# Patient Record
Sex: Female | Born: 1955
Health system: Southern US, Community
[De-identification: ages and names within clinical notes are randomized; demographics above are authoritative.]

## PROBLEM LIST (undated history)

## (undated) DIAGNOSIS — G8929 Other chronic pain: Secondary | ICD-10-CM

## (undated) DIAGNOSIS — J189 Pneumonia, unspecified organism: Secondary | ICD-10-CM

## (undated) DIAGNOSIS — I10 Essential (primary) hypertension: Secondary | ICD-10-CM

## (undated) DIAGNOSIS — F1020 Alcohol dependence, uncomplicated: Secondary | ICD-10-CM

## (undated) DIAGNOSIS — B1920 Unspecified viral hepatitis C without hepatic coma: Secondary | ICD-10-CM

## (undated) DIAGNOSIS — F319 Bipolar disorder, unspecified: Secondary | ICD-10-CM

## (undated) DIAGNOSIS — K219 Gastro-esophageal reflux disease without esophagitis: Secondary | ICD-10-CM

## (undated) DIAGNOSIS — J45909 Unspecified asthma, uncomplicated: Secondary | ICD-10-CM

## (undated) DIAGNOSIS — K769 Liver disease, unspecified: Secondary | ICD-10-CM

## (undated) DIAGNOSIS — Z8719 Personal history of other diseases of the digestive system: Secondary | ICD-10-CM

## (undated) DIAGNOSIS — F32A Depression, unspecified: Secondary | ICD-10-CM

## (undated) DIAGNOSIS — M1990 Unspecified osteoarthritis, unspecified site: Secondary | ICD-10-CM

## (undated) DIAGNOSIS — F419 Anxiety disorder, unspecified: Secondary | ICD-10-CM

## (undated) DIAGNOSIS — F329 Major depressive disorder, single episode, unspecified: Secondary | ICD-10-CM

## (undated) DIAGNOSIS — F1910 Other psychoactive substance abuse, uncomplicated: Secondary | ICD-10-CM

## (undated) DIAGNOSIS — J449 Chronic obstructive pulmonary disease, unspecified: Secondary | ICD-10-CM

## (undated) DIAGNOSIS — R569 Unspecified convulsions: Secondary | ICD-10-CM

## (undated) DIAGNOSIS — N390 Urinary tract infection, site not specified: Secondary | ICD-10-CM

## (undated) HISTORY — DX: Other chronic pain: G89.29

## (undated) HISTORY — DX: Liver disease, unspecified: K76.9

## (undated) HISTORY — PX: TONSILLECTOMY: SUR1361

## (undated) HISTORY — DX: Depression, unspecified: F32.A

## (undated) HISTORY — DX: Unspecified asthma, uncomplicated: J45.909

## (undated) HISTORY — DX: Urinary tract infection, site not specified: N39.0

## (undated) HISTORY — DX: Unspecified viral hepatitis C without hepatic coma: B19.20

## (undated) HISTORY — DX: Personal history of other diseases of the digestive system: Z87.19

## (undated) HISTORY — PX: TUBAL LIGATION: SHX77

## (undated) HISTORY — DX: Essential (primary) hypertension: I10

## (undated) HISTORY — DX: Alcohol dependence, uncomplicated: F10.20

## (undated) HISTORY — DX: Unspecified convulsions: R56.9

## (undated) HISTORY — DX: Bipolar disorder, unspecified: F31.9

## (undated) HISTORY — DX: Gastro-esophageal reflux disease without esophagitis: K21.9

## (undated) HISTORY — DX: Pneumonia, unspecified organism: J18.9

## (undated) HISTORY — PX: OTHER SURGICAL HISTORY: SHX169

## (undated) HISTORY — DX: Other psychoactive substance abuse, uncomplicated: F19.10

## (undated) HISTORY — DX: Anxiety disorder, unspecified: F41.9

## (undated) HISTORY — DX: Unspecified osteoarthritis, unspecified site: M19.90

## (undated) HISTORY — DX: Chronic obstructive pulmonary disease, unspecified: J44.9

---
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
## 2014-02-19 HISTORY — PX: ESOPHAGOGASTRODUODENOSCOPY: SHX1529

## 2014-02-19 HISTORY — PX: COLONOSCOPY: SHX174

## 2018-12-27 ENCOUNTER — Encounter: Payer: Self-pay | Admitting: Gastroenterology

## 2019-01-14 ENCOUNTER — Encounter: Payer: Self-pay | Admitting: Gastroenterology

## 2019-01-22 ENCOUNTER — Encounter: Payer: Self-pay | Admitting: Gastroenterology

## 2019-01-22 ENCOUNTER — Ambulatory Visit (INDEPENDENT_AMBULATORY_CARE_PROVIDER_SITE_OTHER): Payer: Medicare Other | Admitting: Gastroenterology

## 2019-01-22 ENCOUNTER — Other Ambulatory Visit: Payer: Self-pay

## 2019-01-22 VITALS — BP 136/82 | HR 70 | Temp 98.2°F | Ht 72.0 in | Wt 205.1 lb

## 2019-01-22 DIAGNOSIS — Z1159 Encounter for screening for other viral diseases: Secondary | ICD-10-CM

## 2019-01-22 DIAGNOSIS — Z800 Family history of malignant neoplasm of digestive organs: Secondary | ICD-10-CM

## 2019-01-22 DIAGNOSIS — R7989 Other specified abnormal findings of blood chemistry: Secondary | ICD-10-CM

## 2019-01-22 DIAGNOSIS — Z1211 Encounter for screening for malignant neoplasm of colon: Secondary | ICD-10-CM | POA: Diagnosis not present

## 2019-01-22 DIAGNOSIS — R1013 Epigastric pain: Secondary | ICD-10-CM | POA: Diagnosis not present

## 2019-01-22 DIAGNOSIS — K219 Gastro-esophageal reflux disease without esophagitis: Secondary | ICD-10-CM | POA: Diagnosis not present

## 2019-01-22 DIAGNOSIS — R945 Abnormal results of liver function studies: Secondary | ICD-10-CM

## 2019-01-22 MED ORDER — OMEPRAZOLE 20 MG PO CPDR: 20.0000 mg | DELAYED_RELEASE_CAPSULE | Freq: Every day | ORAL | 11 refills | Status: AC

## 2019-01-22 MED ORDER — SUPREP BOWEL PREP KIT 17.5-3.13-1.6 GM/177ML PO SOLN: 1.0000 | ORAL | 0 refills | Status: AC

## 2019-01-22 MED ORDER — ONDANSETRON 4 MG PO TBDP: ORAL_TABLET | ORAL | 0 refills | Status: DC
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
## 2019-01-22 NOTE — Patient Instructions (Addendum)
If you are age 64 or older, your body mass index should be between 23-30. Your Body mass index is 27.82 kg/m. If this is out of the aforementioned range listed, please consider follow up with your Primary Care Provider.  If you are age 38 or younger, your body mass index should be between 19-25. Your Body mass index is 27.82 kg/m. If this is out of the aformentioned range listed, please consider follow up with your Primary Care Provider.   You have been scheduled for an abdominal ultrasound with elastography at [facility] (1st floor). Your appointment is scheduled for 01/29/19 at 10am. Please arrive 15 minutes prior to your scheduled appointment for registration purposes. Make certain not to have anything to eat or drink 6 hours prior to your procedure. Should you need to reschedule your appointment, you may contact radiology at [phone].  Liver Elastography Various chronic liver diseases such as hepatitis B, C, and fatty liver disease can lead to tissue damage and subsequent scar tissue formation. As the scar tissue accumulates, the liver loses some of its elasticity and becomes stiffer. Liver elastography involves the use of a surface ultrasound probe that delivers a low frequency pulse or shear wave to a small volume of liver tissue under the rib cage. The transmission of the sound wave is completely painless. How Is a Liver Elastography Performed? The liver is located in the right upper abdomen under the rib cage. Patients are asked to lie flat on an examination table. A technician places the FibroScan probe between the ribs on the right side of the lower chest wall. A series of 10 painless pulses are then applied to the liver. The results are recorded on the equipment and an overall liver stiffness score is generated. This score is then interpreted by a qualified physician to predict the likelihood of advanced fibrosis or cirrhosis.  Patients are asked to wear loose clothing and  should not consume any liquids or solids for a minimum of 4 hours before the test to increase the likelihood of obtaining reliable test results. The scan will take 10 to 15 minutes to complete, but patients should plan on being available for 30 minutes to allow time for preparation   Please go to the lab at [facility] ([place] You will need to go to level "B", you do not need an appointment for this. Hours available are 7:30 am - 4:30 pm.    You have been scheduled for an endoscopy and colonoscopy. Please follow the written instructions given to you at your visit today. Please pick up your prep supplies at the pharmacy within the next 1-3 days. If you use inhalers (even only as needed), please bring them with you on the day of your procedure. Your physician has requested that you go to www.startemmi.com and enter the access code given to you at your visit today. This web site gives a general overview about your procedure. However, you should still follow specific instructions given to you by our office regarding your preparation for the procedure.  Due to recent COVID-19 restrictions implemented by [employer] and state authorities and in an effort to keep both patients and staff as safe as possible, [place] requires COVID-19 testing prior to any scheduled endoscopic procedure. The testing center is located at [address] [place], [place] [address] in the [facility] [employer]  suite.  Your appointment has been scheduled for 12:20pm  on 02/10/19.  Please bring your insurance cards to this appointment. You will require your COVID screen 2 business days prior to your endoscopic procedure.  You are not required to quarantine after your screening.  You will only receive a phone call with the results if it is POSITIVE.  If you do not receive a call the day before your procedure you should begin your prep, if ordered,  and you should report to the endo center for your procedure at your designated appointment arrival time ( one hour prior to the procedure time). There is no cost to you for the screening on the day of the swab.  [facility] will file with your insurance company for the testing.    You may receive an automated phone call prior to your procedure or have a message in your MyChart that you have an appointment for a BP/15 at the [facility], please disregard this message.  Your testing will be at the [place] [place] location.   If you are leaving [facility] travel [address] on [address], turn left onto [address], turn night onto [address] at the 1st stop light turn right, pass the [employer] on your right and proceed to [place] (white building).   We have sent the following medications to your pharmacy for you to pick up at your convenience: Omeprazole Zofran Suprep  Two days before your procedure: Mix 3 packs (or capfuls) of Miralax in 48 ounces of clear liquid and drink at 6pm.    Thank you,  Dr. Jackquline Denmark

## 2019-01-28 ENCOUNTER — Telehealth: Payer: Self-pay | Admitting: Gastroenterology

## 2019-01-28 MED ORDER — ONDANSETRON 4 MG PO TBDP: ORAL_TABLET | ORAL | 0 refills | Status: DC

## 2019-01-28 NOTE — Telephone Encounter (Signed)
Pt stated that [facility] told her that quantity was not specified on ondansetron prescription.

## 2019-01-28 NOTE — Telephone Encounter (Signed)
Resent prescription to patients pharmacy.  

## 2019-01-29 ENCOUNTER — Other Ambulatory Visit: Payer: Self-pay

## 2019-01-29 ENCOUNTER — Ambulatory Visit (HOSPITAL_COMMUNITY)
Admission: RE | Admit: 2019-01-29 | Discharge: 2019-01-29 | Disposition: A | Discharge status: Home / Self Care | Payer: Medicare Other | Source: Ambulatory Visit | Attending: Gastroenterology | Admitting: Gastroenterology

## 2019-01-29 DIAGNOSIS — Z1211 Encounter for screening for malignant neoplasm of colon: Secondary | ICD-10-CM | POA: Diagnosis present

## 2019-01-29 DIAGNOSIS — R1013 Epigastric pain: Secondary | ICD-10-CM | POA: Insufficient documentation

## 2019-01-29 DIAGNOSIS — Z800 Family history of malignant neoplasm of digestive organs: Secondary | ICD-10-CM | POA: Diagnosis present

## 2019-01-29 DIAGNOSIS — R7989 Other specified abnormal findings of blood chemistry: Secondary | ICD-10-CM

## 2019-01-29 DIAGNOSIS — R945 Abnormal results of liver function studies: Secondary | ICD-10-CM

## 2019-01-29 DIAGNOSIS — K219 Gastro-esophageal reflux disease without esophagitis: Secondary | ICD-10-CM | POA: Insufficient documentation

## 2019-01-29 LAB — HEPATITIS A ANTIBODY, TOTAL: hep A Total Ab: REACTIVE — AB

## 2019-01-29 LAB — HEPATITIS B SURFACE ANTIGEN: Hepatitis B Surface Ag: NONREACTIVE

## 2019-01-30 ENCOUNTER — Other Ambulatory Visit: Payer: Self-pay

## 2019-01-30 LAB — HCV RNA QUANT
HCV Quantitative Log: 6.336 log10 IU/mL (ref >1.70)
HCV Quantitative: 2170000 IU/mL (ref >50)

## 2019-01-30 MED ORDER — ONDANSETRON 4 MG PO TBDP: ORAL_TABLET | ORAL | 0 refills | Status: DC

## 2019-01-31 ENCOUNTER — Other Ambulatory Visit: Payer: Self-pay

## 2019-01-31 DIAGNOSIS — D509 Iron deficiency anemia, unspecified: Secondary | ICD-10-CM

## 2019-01-31 DIAGNOSIS — K50919 Crohn's disease, unspecified, with unspecified complications: Secondary | ICD-10-CM

## 2019-01-31 DIAGNOSIS — R197 Diarrhea, unspecified: Secondary | ICD-10-CM

## 2019-02-03 ENCOUNTER — Telehealth: Payer: Self-pay | Admitting: Gastroenterology

## 2019-02-03 DIAGNOSIS — D509 Iron deficiency anemia, unspecified: Secondary | ICD-10-CM

## 2019-02-03 DIAGNOSIS — K50919 Crohn's disease, unspecified, with unspecified complications: Secondary | ICD-10-CM

## 2019-02-03 DIAGNOSIS — R197 Diarrhea, unspecified: Secondary | ICD-10-CM

## 2019-02-03 NOTE — Telephone Encounter (Signed)
Called and spoke with Margaret-Margaret informed of information in chart and that the patient is aware of need for additional lab work; Joycelyn Schmid reports she will notify the patient of appt date/time for [facility]; amb ref sent for pre cert request for patient to be seen by [facility];

## 2019-02-03 NOTE — Telephone Encounter (Signed)
Margaret at [employer]. For Infectious disease need clarification on order sent. She states that order indicates Hep C but dx crohns. Pls call her.

## 2019-02-06 ENCOUNTER — Ambulatory Visit (INDEPENDENT_AMBULATORY_CARE_PROVIDER_SITE_OTHER): Payer: Medicare Other | Admitting: Family

## 2019-02-06 ENCOUNTER — Other Ambulatory Visit: Payer: Self-pay

## 2019-02-06 ENCOUNTER — Encounter: Payer: Self-pay | Admitting: Family

## 2019-02-06 ENCOUNTER — Telehealth: Payer: Self-pay | Admitting: Pharmacy Technician

## 2019-02-06 DIAGNOSIS — B182 Chronic viral hepatitis C: Secondary | ICD-10-CM | POA: Diagnosis present

## 2019-02-06 NOTE — Telephone Encounter (Signed)
[facility] Patient Advocate Encounter    Findings of the benefits investigation for 11/04:   Insurance: [employer] -active Estimated copay amount: $2941.25 (her deductible has been met)  Prior Authorization: indicated that one is not needed at this time.  Will reach out to the patient via phone to get her household size and income and we can file to get a $30,000 grant to cover the duration of her treatment. We will have no problem getting the med to the patient at $0. Filling at [facility].

## 2019-02-06 NOTE — Assessment & Plan Note (Signed)
Kayla Pierce is a 63 year old female with chronic hepatitis C who is treatment experienced previously with Ribavarin and interferon x6 months.  It is unclear if she achieved a sustained viremic response and successfully cleared hepatitis C versus treatment failure of her previous regimen.  She currently has a viral load of 2.17 million and recent ultrasound with no masses or lesions.  Reviewed available lab work and discussed plan of care including treatment options for hepatitis C.  We will check genotype, platelets, and hepatic panel today.  She has no infection with hepatitis B.  Treatment will likely be either Epclusa or Mavyret pending blood work results.
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
## 2019-02-06 NOTE — Patient Instructions (Signed)
Nice to see you.  We will check your blood work today and call you with the results.  Limit acetaminophen (Tylenol) usage to no more than 2 grams (2,000 mg) per day.  Avoid alcohol.  Do not share toothbrushes or razors.  Practice safe sex to protect against transmission as well as sexually transmitted disease.    Hepatitis C Hepatitis C is a viral infection of the liver. It can lead to scarring of the liver (cirrhosis), liver failure, or liver cancer. Hepatitis C may go undetected for months or years because people with the infection may not have symptoms, or they may have only mild symptoms. What are the causes? This condition is caused by the hepatitis C virus (HCV). The virus can spread from person to person (is contagious) through:  Blood.  Childbirth. A woman who has hepatitis C can pass it to her baby during birth.  Bodily fluids, such as breast milk, tears, semen, vaginal fluids, and saliva.  Blood transfusions or organ transplants done in the [place] before 1992.  What increases the risk? The following factors may make you more likely to develop this condition:  Having contact with unclean (contaminated) needles or syringes. This may result from: ? Acupuncture. ? Tattoing. ? Body piercing. ? Injecting drugs.  Having unprotected sex with someone who is infected.  Needing treatment to filter your blood (kidney dialysis).  Having HIV (human immunodeficiency virus) or AIDS (acquired immunodeficiency syndrome).  Working in a job that involves contact with blood or bodily fluids, such as health care.  What are the signs or symptoms? Symptoms of this condition include:  Fatigue.  Loss of appetite.  Nausea.  Vomiting.  Abdominal pain.  Dark yellow urine.  Yellowish skin and eyes (jaundice).  Itchy skin.  Clay-colored bowel movements.  Joint pain.  Bleeding and bruising easily.  Fluid building up in your stomach (ascites).  In some cases, you  may not have any symptoms. How is this diagnosed? This condition is diagnosed with:  Blood tests.  Other tests to check how well your liver is functioning. They may include: ? Magnetic resonance elastography (MRE). This imaging test uses MRIs and sound waves to measure liver stiffness. ? Transient elastography. This imaging test uses ultrasounds to measure liver stiffness. ? Liver biopsy. This test requires taking a small tissue sample from your liver to examine it under a microscope.  How is this treated? Your health care provider may perform noninvasive tests or a liver biopsy to help decide the best course of treatment. Treatment may include:  Antiviral medicines and other medicines.  Follow-up treatments every 6-12 months for infections or other liver conditions.  Receiving a donated liver (liver transplant).  Follow these instructions at home: Medicines  Take over-the-counter and prescription medicines only as told by your health care provider.  Take your antiviral medicine as told by your health care provider. Do not stop taking the antiviral even if you start to feel better.  Do not take any medicines unless approved by your health care provider, including over-the-counter medicines and birth control pills. Activity  Rest as needed.  Do not have sex unless approved by your health care provider.  Ask your health care provider when you may return to school or work. Eating and drinking  Eat a balanced diet with plenty of fruits and vegetables, whole grains, and lowfat (lean) meats or non-meat proteins (such as beans or tofu).  Drink enough fluids to keep your urine clear or pale yellow.  Do not drink alcohol. General instructions  Do not share toothbrushes, nail clippers, or razors.  Wash your hands frequently with soap and water. If soap and water are not available, use hand sanitizer.  Cover any cuts or open sores on your skin to prevent spreading the virus.   Keep all follow-up visits as told by your health care provider. This is important. You may need follow-up visits every 6-12 months. How is this prevented? There is no vaccine for hepatitis C. The only way to prevent the disease is to reduce the risk of exposure to the virus. Make sure you:  Wash your hands frequently with soap and water. If soap and water are not available, use hand sanitizer.  Do not share needles or syringes.  Practice safe sex and use condoms.  Avoid handling blood or bodily fluids without gloves or other protection.  Avoid getting tattoos or piercings in shops or other locations that are not clean.  Contact a health care provider if:  You have a fever.  You develop abdominal pain.  You pass dark urine.  You pass clay-colored stools.  You develop joint pain. Get help right away if:  You have increasing fatigue or weakness.  You lose your appetite.  You cannot eat or drink without vomiting.  You develop jaundice or your jaundice gets worse.  You bruise or bleed easily. Summary  Hepatitis C is a viral infection of the liver. It can lead to scarring of the liver (cirrhosis), liver failure, or liver cancer.  The hepatitis C virus (HCV) causes this condition. The virus can pass from person to person (is contagious).  You should not take any medicines unless approved by your health care provider. This includes over-the-counter medicines and birth control pills. This information is not intended to replace advice given to you by your health care provider. Make sure you discuss any questions you have with your health care provider. Document Released: 03/17/2000 Document Revised: 04/25/2016 Document Reviewed: 04/25/2016 Elsevier Interactive Patient Education  [name].

## 2019-02-10 ENCOUNTER — Other Ambulatory Visit: Payer: Self-pay | Admitting: Gastroenterology

## 2019-02-10 ENCOUNTER — Encounter

## 2019-02-11 ENCOUNTER — Other Ambulatory Visit: Payer: Self-pay | Admitting: Family

## 2019-02-11 LAB — HEPATIC FUNCTION PANEL
AG Ratio: 1.1 (calc) (ref 1.0–2.5)
ALT: 93 U/L — ABNORMAL HIGH (ref 6–29)
AST: 92 U/L — ABNORMAL HIGH (ref 10–35)
Albumin: 3.7 g/dL (ref 3.6–5.1)
Alkaline phosphatase (APISO): 76 U/L (ref 37–153)
Bilirubin, Direct: 0.2 mg/dL (ref 0.0–0.2)
Globulin: 3.3 g/dL (calc) (ref 1.9–3.7)
Indirect Bilirubin: 0.4 mg/dL (calc) (ref 0.2–1.2)
Total Bilirubin: 0.6 mg/dL (ref 0.2–1.2)
Total Protein: 7 g/dL (ref 6.1–8.1)

## 2019-02-11 LAB — CBC
HCT: 44.2 % (ref 35.0–45.0)
Hemoglobin: 15.3 g/dL (ref 11.7–15.5)
MCH: 31.5 pg (ref 27.0–33.0)
MCHC: 34.6 g/dL (ref 32.0–36.0)
MCV: 90.9 fL (ref 80.0–100.0)
MPV: 12.5 fL (ref 7.5–12.5)
Platelets: 96 10*3/uL — ABNORMAL LOW (ref 140–400)
RBC: 4.86 10*6/uL (ref 3.80–5.10)
RDW: 12.2 % (ref 11.0–15.0)
WBC: 5.8 10*3/uL (ref 3.8–10.8)

## 2019-02-11 LAB — HEPATITIS B SURFACE ANTIBODY,QUALITATIVE: Hep B S Ab: REACTIVE — AB

## 2019-02-11 LAB — HEPATITIS C GENOTYPE

## 2019-02-11 LAB — SARS CORONAVIRUS 2 (TAT 6-24 HRS): SARS Coronavirus 2: NEGATIVE

## 2019-02-11 MED ORDER — MAVYRET 100-40 MG PO TABS: 3.0000 | ORAL_TABLET | Freq: Every day | ORAL | 2 refills | Status: DC

## 2019-02-11 MED ORDER — MAVYRET 100-40 MG PO TABS: 3.0000 | ORAL_TABLET | Freq: Every day | ORAL | 2 refills | Status: AC

## 2019-02-11 NOTE — Addendum Note (Signed)
Addended by: Darletta Moll on: 02/11/2019 04:59 PM   Modules accepted: Orders

## 2019-02-12 ENCOUNTER — Other Ambulatory Visit: Payer: Self-pay

## 2019-02-12 ENCOUNTER — Ambulatory Visit (AMBULATORY_SURGERY_CENTER): Payer: Medicare Other | Admitting: Gastroenterology

## 2019-02-12 ENCOUNTER — Encounter: Payer: Self-pay | Admitting: Gastroenterology

## 2019-02-12 VITALS — BP 138/80 | HR 59 | Temp 98.8°F | Resp 12 | Ht 72.0 in | Wt 199.0 lb

## 2019-02-12 DIAGNOSIS — K2100 Gastro-esophageal reflux disease with esophagitis, without bleeding: Secondary | ICD-10-CM

## 2019-02-12 DIAGNOSIS — Z800 Family history of malignant neoplasm of digestive organs: Secondary | ICD-10-CM | POA: Diagnosis not present

## 2019-02-12 DIAGNOSIS — D124 Benign neoplasm of descending colon: Secondary | ICD-10-CM | POA: Diagnosis not present

## 2019-02-12 DIAGNOSIS — D122 Benign neoplasm of ascending colon: Secondary | ICD-10-CM

## 2019-02-12 DIAGNOSIS — K2950 Unspecified chronic gastritis without bleeding: Secondary | ICD-10-CM | POA: Diagnosis not present

## 2019-02-12 DIAGNOSIS — Z1211 Encounter for screening for malignant neoplasm of colon: Secondary | ICD-10-CM | POA: Diagnosis present

## 2019-02-12 MED ORDER — SODIUM CHLORIDE 0.9 % IV SOLN: 500.0000 mL | Freq: Once | INTRAVENOUS | Status: DC

## 2019-02-12 NOTE — Progress Notes (Signed)
Called to room to assist during endoscopic procedure.  Patient ID and intended procedure confirmed with present staff. Received instructions for my participation in the procedure from the performing physician.  

## 2019-02-12 NOTE — Patient Instructions (Signed)
HANDOUTS PROVIDED ON: GASTRITIS, POLYPS, DIVERTICULOSIS, & HEMORRHOIDS  THE BIOPSIES & POLYPS TAKEN TODAY HAVE BEEN SENT FOR PATHOLOGY.  THE RESULTS CAN TAKE 2-3 WEEKS TO RECEIVE.  BASED ON THE RESULTS IS WHEN YOUR NEXT COLONOSCOPY WILL BE RECOMMENDED.  YOU MAY RESUME YOUR PREVIOUS DIET AND MEDICATION SCHEDULE.  [place] YOU FOR ALLOWING [place] TO CARE FOR YOU TODAY!!!  YOU HAD AN ENDOSCOPIC PROCEDURE TODAY AT [facility]:   Refer to the procedure report that was given to you for any specific questions about what was found during the examination.  If the procedure report does not answer your questions, please call your gastroenterologist to clarify.  If you requested that your care partner not be given the details of your procedure findings, then the procedure report has been included in a sealed envelope for you to review at your convenience later.  YOU SHOULD EXPECT: Some feelings of bloating in the abdomen. Passage of more gas than usual.  Walking can help get rid of the air that was put into your GI tract during the procedure and reduce the bloating. If you had a lower endoscopy (such as a colonoscopy or flexible sigmoidoscopy) you may notice spotting of blood in your stool or on the toilet paper. If you underwent a bowel prep for your procedure, you may not have a normal bowel movement for a few days.  Please Note:  You might notice some irritation and congestion in your nose or some drainage.  This is from the oxygen used during your procedure.  There is no need for concern and it should clear up in a day or so.  SYMPTOMS TO REPORT IMMEDIATELY:   Following lower endoscopy (colonoscopy or flexible sigmoidoscopy):  Excessive amounts of blood in the stool  Significant tenderness or worsening of abdominal pains  Swelling of the abdomen that is new, acute  Fever of 100F or higher   Following upper endoscopy (EGD)  Vomiting of blood or coffee ground material  New chest pain or  pain under the shoulder blades  Painful or persistently difficult swallowing  New shortness of breath  Fever of 100F or higher  Black, tarry-looking stools  For urgent or emergent issues, a gastroenterologist can be reached at any hour by calling [phone].   DIET:  We do recommend a small meal at first, but then you may proceed to your regular diet.  Drink plenty of fluids but you should avoid alcoholic beverages for 24 hours.  ACTIVITY:  You should plan to take it easy for the rest of today and you should NOT DRIVE or use heavy machinery until tomorrow (because of the sedation medicines used during the test).    FOLLOW UP: Our staff will call the number listed on your records 48-72 hours following your procedure to check on you and address any questions or concerns that you may have regarding the information given to you following your procedure. If we do not reach you, we will leave a message.  We will attempt to reach you two times.  During this call, we will ask if you have developed any symptoms of COVID 19. If you develop any symptoms (ie: fever, flu-like symptoms, shortness of breath, cough etc.) before then, please call [phone].  If you test positive for Covid 19 in the 2 weeks post procedure, please call and report this information to [place].    If any biopsies were taken you will be contacted by phone or by letter within the  next 1-3 weeks.  Please call us at [phone] if you have not heard about the biopsies in 3 weeks.    SIGNATURES/CONFIDENTIALITY: You and/or your care partner have signed paperwork which will be entered into your electronic medical record.  These signatures attest to the fact that that the information above on your After Visit Summary has been reviewed and is understood.  Full responsibility of the confidentiality of this discharge information lies with you and/or your care-partner.

## 2019-02-12 NOTE — Progress Notes (Signed)
Pt's states no medical or surgical changes since previsit or office visit. 

## 2019-02-12 NOTE — Progress Notes (Signed)
PT taken to PACU. Monitors in place. VSS. Report given to RN. 

## 2019-02-12 NOTE — Op Note (Signed)
[facility] Patient Name: Kayla Pierce Procedure Date: 02/12/2019 1:15 PM MRN: [id number] Endoscopist: Jackquline Denmark , MD Age: 63 Referring MD:  Date of Birth: 07-Jul-1955 Gender: Female Account #: [id number] Procedure:                Upper GI endoscopy Indications:              Epigastric abdominal pain, GERD Medicines:                Monitored Anesthesia Care Procedure:                Pre-Anesthesia Assessment:                           - Prior to the procedure, a History and Physical                            was performed, and patient medications and                            allergies were reviewed. The patient's tolerance of                            previous anesthesia was also reviewed. The risks                            and benefits of the procedure and the sedation                            options and risks were discussed with the patient.                            All questions were answered, and informed consent                            was obtained. Prior Anticoagulants: The patient has                            taken no previous anticoagulant or antiplatelet                            agents. ASA Grade Assessment: III - A patient with                            severe systemic disease. After reviewing the risks                            and benefits, the patient was deemed in                            satisfactory condition to undergo the procedure.                           After obtaining informed consent, the endoscope was  passed under direct vision. Throughout the                            procedure, the patient's blood pressure, pulse, and                            oxygen saturations were monitored continuously. The                            Endoscope was introduced through the mouth, and                            advanced to the second part of duodenum. The upper                            GI endoscopy was  accomplished without difficulty.                            The patient tolerated the procedure well. Scope In: Scope Out: Findings:                 The examined esophagus was normal except for a 4 mm                            papilloma in the mid esophagus 30 cm from the                            incisors. This was removed by cold biopsy forceps.                            No esophageal varices.                           The Z-line was regular and was found 40 cm from the                            incisors.                           Localized minimal inflammation characterized by                            erythema was found in the gastric antrum. Biopsies                            were taken with a cold forceps for histology.                           The examined duodenum was normal. Complications:            No immediate complications. Estimated Blood Loss:     Estimated blood loss: none. Impression:               -Small mid esophageal papilloma s/p removal                           -  Mild gastritis. Recommendation:           - Patient has a contact number available for                            emergencies. The signs and symptoms of potential                            delayed complications were discussed with the                            patient. Return to normal activities tomorrow.                            Written discharge instructions were provided to the                            patient.                           - Resume previous diet.                           - Continue present medications.                           - Await pathology results.                           - No aspirin, ibuprofen, naproxen, or other                            non-steroidal anti-inflammatory drugs for 5 days. Jackquline Denmark, MD 02/12/2019 2:15:18 PM This report has been signed electronically.

## 2019-02-12 NOTE — Op Note (Signed)
[facility] Patient Name: Kayla Pierce Procedure Date: 02/12/2019 1:14 PM MRN: [id number] Endoscopist: Jackquline Denmark , MD Age: 63 Referring MD:  Date of Birth: 01-31-1956 Gender: Female Account #: [id number] Procedure:                Colonoscopy Indications:              Screening for colorectal malignant neoplasm. FH of                            colon cancer in a second-degree relative. Medicines:                Monitored Anesthesia Care Procedure:                Pre-Anesthesia Assessment:                           - Prior to the procedure, a History and Physical                            was performed, and patient medications and                            allergies were reviewed. The patient's tolerance of                            previous anesthesia was also reviewed. The risks                            and benefits of the procedure and the sedation                            options and risks were discussed with the patient.                            All questions were answered, and informed consent                            was obtained. Prior Anticoagulants: The patient has                            taken no previous anticoagulant or antiplatelet                            agents. ASA Grade Assessment: III - A patient with                            severe systemic disease. After reviewing the risks                            and benefits, the patient was deemed in                            satisfactory condition to undergo the procedure.  After obtaining informed consent, the colonoscope                            was passed under direct vision. Throughout the                            procedure, the patient's blood pressure, pulse, and                            oxygen saturations were monitored continuously. The                            Colonoscope was introduced through the anus and                            advanced to the 2  cm into the ileum. The                            colonoscopy was performed without difficulty. The                            patient tolerated the procedure well. The quality                            of the bowel preparation was good. The terminal                            ileum, ileocecal valve, appendiceal orifice, and                            rectum were photographed. Scope In: 1:51:33 PM Scope Out: 2:09:56 PM Scope Withdrawal Time: 0 hours 11 minutes 15 seconds  Total Procedure Duration: 0 hours 18 minutes 23 seconds  Findings:                 A 2 mm polyp was found in the proximal ascending                            colon. The polyp was sessile. The polyp was removed                            with a cold biopsy forceps. Resection and retrieval                            were complete.                           Two sessile polyps were found in the mid descending                            colon. The polyps were 6 to 8 mm in size. These                            polyps were  removed with a cold snare. Resection                            and retrieval were complete.                           A few small-mouthed diverticula were found in the                            sigmoid colon.                           Non-bleeding internal hemorrhoids were found during                            retroflexion. The hemorrhoids were small.                           The terminal ileum appeared normal.                           The exam was otherwise without abnormality on                            direct and retroflexion views. Complications:            No immediate complications. Estimated Blood Loss:     Estimated blood loss: none. Impression:               -Colonic polyps s/p polypectomy.                           -Mild sigmoid diverticulosis.                           -Small internal hemorrhoids.                           -Otherwise normal colonoscopy to TI. Recommendation:            - Patient has a contact number available for                            emergencies. The signs and symptoms of potential                            delayed complications were discussed with the                            patient. Return to normal activities tomorrow.                            Written discharge instructions were provided to the                            patient.                           -  Resume previous diet.                           - Continue present medications.                           - Await pathology results.                           - Repeat colonoscopy for surveillance based on                            pathology results.                           - Return to GI clinic PRN. Jackquline Denmark, MD 02/12/2019 2:18:58 PM This report has been signed electronically.

## 2019-02-14 ENCOUNTER — Encounter: Payer: Self-pay | Admitting: Pharmacy Technician

## 2019-02-14 ENCOUNTER — Telehealth: Payer: Self-pay

## 2019-02-14 ENCOUNTER — Telehealth: Payer: Self-pay | Admitting: Pharmacist

## 2019-02-14 ENCOUNTER — Other Ambulatory Visit: Payer: Self-pay | Admitting: Pharmacist

## 2019-02-14 DIAGNOSIS — B182 Chronic viral hepatitis C: Secondary | ICD-10-CM

## 2019-02-14 MED FILL — MAVYRET 100-40 MG TABS: 100-40 | 28 days supply | Qty: 84 | Fill #0

## 2019-02-14 NOTE — Telephone Encounter (Signed)
Great - thanks

## 2019-02-14 NOTE — Telephone Encounter (Signed)
  Follow up Call-  Call back number 02/12/2019  Post procedure Call Back phone  # [phone]  Permission to leave phone message Yes  Some recent data might be hidden     Patient questions:  Do you have a fever, pain , or abdominal swelling? No. Pain Score  0 *  Have you tolerated food without any problems? Yes.    Have you been able to return to your normal activities? Yes.    Do you have any questions about your discharge instructions: Diet   No. Medications  No. Follow up visit  No.  Do you have questions or concerns about your Care? No.  Actions: * If pain score is 4 or above: No action needed, pain <4.  1. Have you developed a fever since your procedure? no  2.   Have you had an respiratory symptoms (SOB or cough) since your procedure? no  3.   Have you tested positive for COVID 19 since your procedure no  4.   Have you had any family members/close contacts diagnosed with the COVID 19 since your procedure?  no   If yes to any of these questions please route to Joylene John, RN and Alphonsa Gin, Therapist, sports.

## 2019-02-14 NOTE — Telephone Encounter (Addendum)
[facility] Patient Advocate Encounter  Patient will take [name] for 12 weeks and will receive her first shipment on Monday, November 16 from [facility]. She confirmed her address and prefers shipping due to transportation.  [employer]   I was successful in securing patient a $30,000 grant from [employer] to provide copayment coverage for [name]. This will make the out of pocket cost $0.     The billing information is as follows RxBin: [id number] PCN: [id number] Member ID: [id number] Group ID: [id number] Dates of Eligibility: 01/15/2019 through 01/14/2020  Patient will return on 12/21 for 4 week follow-up appointment

## 2019-02-22 ENCOUNTER — Encounter: Payer: Self-pay | Admitting: Gastroenterology

## 2019-03-12 MED FILL — MAVYRET 100-40 MG TABS: 100-40 | 28 days supply | Qty: 84 | Fill #1

## 2019-03-24 ENCOUNTER — Other Ambulatory Visit: Payer: Self-pay

## 2019-03-24 ENCOUNTER — Other Ambulatory Visit: Payer: Medicare Other

## 2019-03-24 ENCOUNTER — Ambulatory Visit: Payer: Medicare Other | Admitting: Pharmacist

## 2019-03-24 DIAGNOSIS — B182 Chronic viral hepatitis C: Secondary | ICD-10-CM

## 2019-03-30 LAB — COMPREHENSIVE METABOLIC PANEL
AG Ratio: 1.1 (calc) (ref 1.0–2.5)
ALT: 12 U/L (ref 6–29)
AST: 21 U/L (ref 10–35)
Albumin: 4 g/dL (ref 3.6–5.1)
Alkaline phosphatase (APISO): 85 U/L (ref 37–153)
BUN: 15 mg/dL (ref 7–25)
CO2: 26 mmol/L (ref 20–32)
Calcium: 9.5 mg/dL (ref 8.6–10.4)
Chloride: 105 mmol/L (ref 98–110)
Creat: 0.77 mg/dL (ref 0.50–0.99)
Globulin: 3.5 g/dL (calc) (ref 1.9–3.7)
Glucose, Bld: 105 mg/dL — ABNORMAL HIGH (ref 65–99)
Potassium: 4.5 mmol/L (ref 3.5–5.3)
Sodium: 140 mmol/L (ref 135–146)
Total Bilirubin: 1.3 mg/dL — ABNORMAL HIGH (ref 0.2–1.2)
Total Protein: 7.5 g/dL (ref 6.1–8.1)

## 2019-03-30 LAB — HEPATITIS C RNA QUANTITATIVE
HCV Quantitative Log: <1.18 Log IU/mL
HCV RNA, PCR, QN: <15 IU/mL

## 2019-04-07 MED FILL — MAVYRET 100-40 MG TABS: 100-40 | 28 days supply | Qty: 84 | Fill #2

## 2019-04-16 ENCOUNTER — Telehealth: Payer: Self-pay

## 2019-04-16 NOTE — Telephone Encounter (Signed)
Spoke with Ms Kayla Pierce over the phone regarding her recent lab work on 12/21 after 4 weeks of treatment with [place]. Congratulated patient on having undetectable HCV RNA levels and explained to patient the need to check a viral load again at the end of treatment and at 12 weeks following the end of treatment.   Patient endorsed feelings of fatigue that were to be expected but patient did not have any other concerns. Ms. Benge confirmed she takes her Mavyret 3 tablets all at once with breakfast every day and denies issues with adherence. She is scheduled to follow-up with Cassie in the clinic 2/16 at 10am and have her labs drawn then.

## 2019-04-16 NOTE — Telephone Encounter (Signed)
Agree with Kayla Pierce's note.

## 2019-05-20 ENCOUNTER — Ambulatory Visit (INDEPENDENT_AMBULATORY_CARE_PROVIDER_SITE_OTHER): Payer: Medicare Other | Admitting: Pharmacist

## 2019-05-20 ENCOUNTER — Other Ambulatory Visit: Payer: Self-pay

## 2019-05-20 DIAGNOSIS — Z79899 Other long term (current) drug therapy: Secondary | ICD-10-CM

## 2019-05-20 DIAGNOSIS — B182 Chronic viral hepatitis C: Secondary | ICD-10-CM | POA: Diagnosis present

## 2019-05-20 NOTE — Progress Notes (Signed)
HPI: Kayla Pierce is a 64 y.o. female who presents to the [facility] for Hepatitis C follow-up.  Medication: Mavyret  Start Date: 02/17/19  Hepatitis C Genotype: 1a  Fibrosis Score: F4  Hepatitis C RNA: 2.17 million  Patient Active Problem List   Diagnosis Date Noted  . Chronic hepatitis C with hepatic coma ([place]) 02/06/2019    Patient's Medications  New Prescriptions   No medications on file  Previous Medications   ALBUTEROL (PROAIR HFA) 108 (90 BASE) MCG/ACT INHALER    Inhale 2 puffs into the lungs 2 (two) times daily.   CYCLOBENZAPRINE (FLEXERIL) 10 MG TABLET    Take 10 mg by mouth as needed for muscle spasms.   GABAPENTIN (NEURONTIN) 300 MG CAPSULE    Take by mouth.   GLECAPREVIR-PIBRENTASVIR (MAVYRET) 100-40 MG TABS    Take 3 tablets by mouth daily with breakfast.   HYDROCHLOROTHIAZIDE (HYDRODIURIL) 12.5 MG TABLET    Take 12.5 mg by mouth daily.   LITHIUM CARBONATE (LITHOBID) 300 MG CR TABLET    take 1 tablet by oral route at bedtime for 2 nights, then 2 tablets at bedtime for 2 nights, then  3 tablets at bedtime.   OMEPRAZOLE (PRILOSEC) 20 MG CAPSULE    Take 1 capsule (20 mg total) by mouth daily.   SUPREP BOWEL PREP KIT 17.5-3.13-1.6 GM/177ML SOLN    Take 1 kit by mouth as directed.   TIOTROPIUM BROMIDE MONOHYDRATE (SPIRIVA RESPIMAT) 2.5 MCG/ACT AERS    Inhale 1 puff into the lungs daily.   TRAZODONE (DESYREL) 100 MG TABLET    Take by mouth.  Modified Medications   No medications on file  Discontinued Medications   No medications on file    Allergies: Allergies  Allergen Reactions  . Morphine     Pt reports having seizures with morphine.   . Pollen Extract   . Fentanyl Nausea And Vomiting    Past Medical History: Past Medical History:  Diagnosis Date  . Alcoholism ([place])   . Anxiety   . Asthma   . Bipolar disorder ([place])   . Chronic airway obstruction ([place])   . Chronic headaches   . Chronic liver disease   . COPD (chronic obstructive pulmonary  disease) ([place])   . Depression   . Drug abuse ([place])    Cocaine / Alcohol  . GERD (gastroesophageal reflux disease)   . H/O acute pancreatitis   . Hepatitis C   . Hypertension   . Osteoarthritis   . Pneumonia   . Seizure ([place])   . UTI (urinary tract infection)     Social History: Social History   Socioeconomic History  . Marital status: Widowed    Spouse name: Not on file  . Number of children: Not on file  . Years of education: Not on file  . Highest education level: Not on file  Occupational History  . Occupation: Disability  Tobacco Use  . Smoking status: Light Tobacco Smoker  . Smokeless tobacco: Never Used  . Tobacco comment: 2-5 a week  Substance and Sexual Activity  . Alcohol use: Not Currently    Comment: quit 4 months   . Drug use: Not Currently    Types: Cocaine    Comment: quit 4 months  . Sexual activity: Not Currently    Partners: Male    Birth control/protection: Surgical  Other Topics Concern  . Not on file  Social History Narrative  . Not on file   Social Determinants of Health  Financial Resource Strain:   . Difficulty of Paying Living Expenses: Not on file  Food Insecurity:   . Worried About Charity fundraiser in the Last Year: Not on file  . Ran Out of Food in the Last Year: Not on file  Transportation Needs:   . Lack of Transportation (Medical): Not on file  . Lack of Transportation (Non-Medical): Not on file  Physical Activity:   . Days of Exercise per Week: Not on file  . Minutes of Exercise per Session: Not on file  Stress:   . Feeling of Stress : Not on file  Social Connections:   . Frequency of Communication with Friends and Family: Not on file  . Frequency of Social Gatherings with Friends and Family: Not on file  . Attends Religious Services: Not on file  . Active Member of Clubs or Organizations: Not on file  . Attends Archivist Meetings: Not on file  . Marital Status: Not on file    Labs: Hepatitis C Lab  Results  Component Value Date   HCVGENOTYPE 1a 02/06/2019   HCVRNAPCRQN <15 NOT DETECTED 03/24/2019   Hepatitis B Lab Results  Component Value Date   HEPBSAB REACTIVE (A) 02/06/2019   HEPBSAG NON REACTIVE 01/29/2019   Hepatitis A Lab Results  Component Value Date   HAV Reactive (A) 01/29/2019   HIV No results found for: HIV Lab Results  Component Value Date   CREATININE 0.77 03/24/2019   Lab Results  Component Value Date   AST 21 03/24/2019   AST 92 (H) 02/06/2019   ALT 12 03/24/2019   ALT 93 (H) 02/06/2019    Assessment: Kayla Pierce arrives to clinic for end of treatment hepatitis C follow-up. Kayla Pierce reports she finished taking Mavyret x 12 weeks due to being treatment experienced on ribavirin and interferon x 6 months. . on 05/11/19. She said she did not miss any doses and confirmed taking 3 tablets at the same time once daily. Kayla Pierce reported some fatigue from the medication but said it was tolerable. She also mentioned having dehydration but figured out lithium was the cause. Explained to the patient that she will have hepatitis C antibodies, and she should notify future healthcare providers about this. She will follow-up with Marya Amsler in 3 months for cure appointment and further evaluation of cirrhosis.   Plan: - Labs: HCV RNA - Follow-up with Greg May 17 at [place], 4th Year PharmD Student

## 2019-05-23 LAB — CBC
HCT: 41.6 % (ref 35.0–45.0)
Hemoglobin: 14.2 g/dL (ref 11.7–15.5)
MCH: 31.5 pg (ref 27.0–33.0)
MCHC: 34.1 g/dL (ref 32.0–36.0)
MCV: 92.2 fL (ref 80.0–100.0)
MPV: 12.2 fL (ref 7.5–12.5)
Platelets: 108 10*3/uL — ABNORMAL LOW (ref 140–400)
RBC: 4.51 10*6/uL (ref 3.80–5.10)
RDW: 12.3 % (ref 11.0–15.0)
WBC: 6.2 10*3/uL (ref 3.8–10.8)

## 2019-05-23 LAB — COMPREHENSIVE METABOLIC PANEL
AG Ratio: 1.4 (calc) (ref 1.0–2.5)
ALT: 11 U/L (ref 6–29)
AST: 14 U/L (ref 10–35)
Albumin: 4.1 g/dL (ref 3.6–5.1)
Alkaline phosphatase (APISO): 92 U/L (ref 37–153)
BUN: 15 mg/dL (ref 7–25)
CO2: 31 mmol/L (ref 20–32)
Calcium: 9.5 mg/dL (ref 8.6–10.4)
Chloride: 108 mmol/L (ref 98–110)
Creat: 0.6 mg/dL (ref 0.50–0.99)
Globulin: 2.9 g/dL (calc) (ref 1.9–3.7)
Glucose, Bld: 71 mg/dL (ref 65–99)
Potassium: 4.2 mmol/L (ref 3.5–5.3)
Sodium: 142 mmol/L (ref 135–146)
Total Bilirubin: 0.4 mg/dL (ref 0.2–1.2)
Total Protein: 7 g/dL (ref 6.1–8.1)

## 2019-05-23 LAB — HEPATITIS C RNA QUANTITATIVE
HCV Quantitative Log: <1.18 Log IU/mL
HCV RNA, PCR, QN: <15 IU/mL

## 2019-08-18 ENCOUNTER — Ambulatory Visit: Payer: Medicare Other | Admitting: Family

## 2019-10-20 ENCOUNTER — Other Ambulatory Visit: Payer: Self-pay

## 2019-10-20 ENCOUNTER — Ambulatory Visit: Payer: Medicare Other | Admitting: Family

## 2019-11-13 ENCOUNTER — Ambulatory Visit: Payer: Medicare Other | Admitting: Family

## 2019-12-11 ENCOUNTER — Ambulatory Visit: Payer: Medicare Other | Admitting: Family

## 2020-11-07 IMAGING — US US ABDOMEN COMPLETE W/ ELASTOGRAPHY
2 series · 12 of 25 positions shown · non-contrast
Comparison: None.

CLINICAL DATA: Fatty liver, hepatitis C, abdominal pain x5 months

EXAM:
ULTRASOUND ABDOMEN
ULTRASOUND HEPATIC ELASTOGRAPHY
TECHNIQUE: Sonography of the upper abdomen was performed. In addition,
ultrasound elastography evaluation of the liver was performed. A
region of interest was placed within the right lobe of the liver.
Following application of a compressive sonographic pulse, tissue
compressibility was assessed. Multiple assessments were performed at
the selected site. Median tissue compressibility was determined.
Previously, hepatic stiffness was assessed by shear wave velocity.
Based on recently published Society of Radiologists in Ultrasound
consensus article, reporting is now recommended to be performed in
the SI units of pressure (kiloPascals) representing hepatic
stiffness/elasticity. The obtained result is compared to the
published reference standards. (cACLD= compensated Advanced Chronic
Liver Disease)

[Series 1: us abdomen complete w/ elastography · 11 of 99 slices shown (1 of 2)]
[im 5/99]
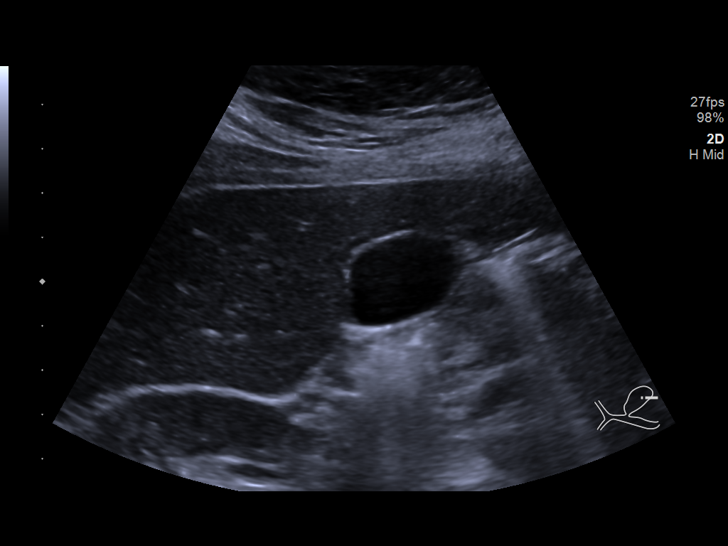
[im 14/99]
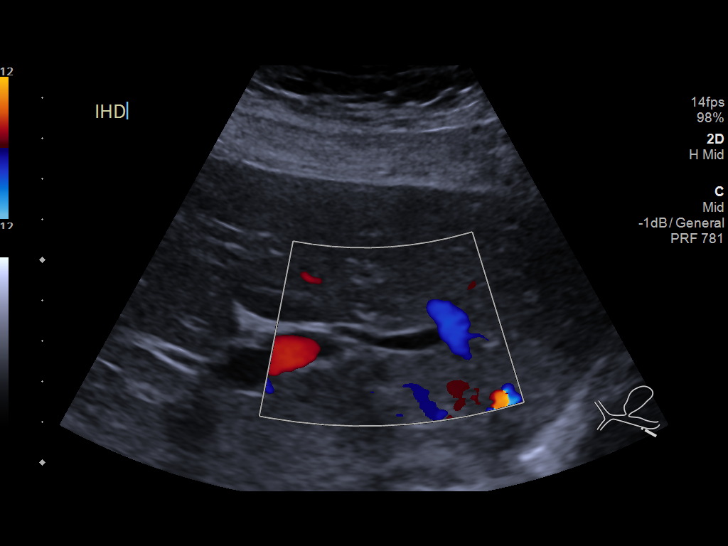
[im 23/99]
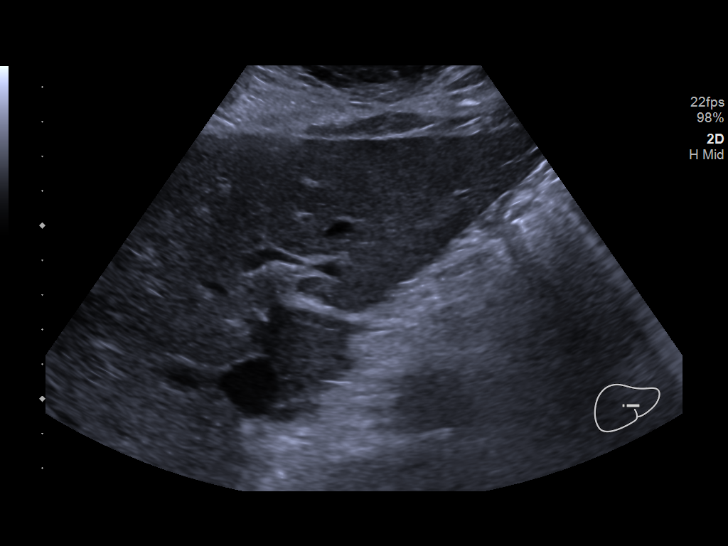
[im 32/99]
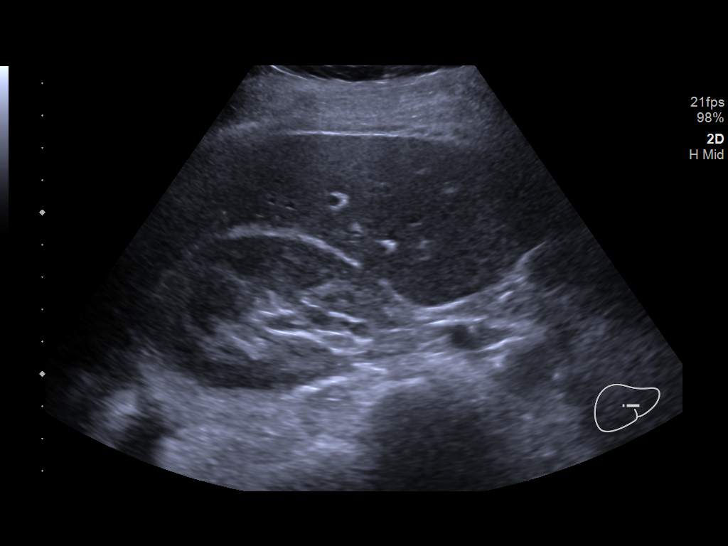
[im 41/99]
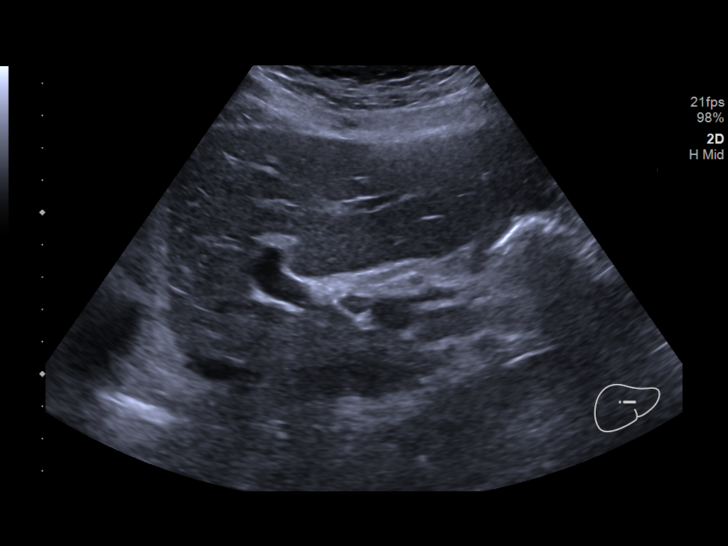
[im 49/99]
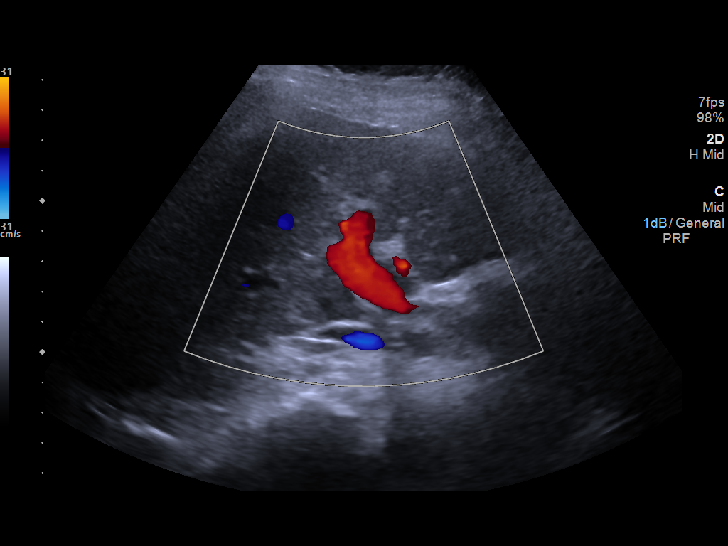
[im 58/99]
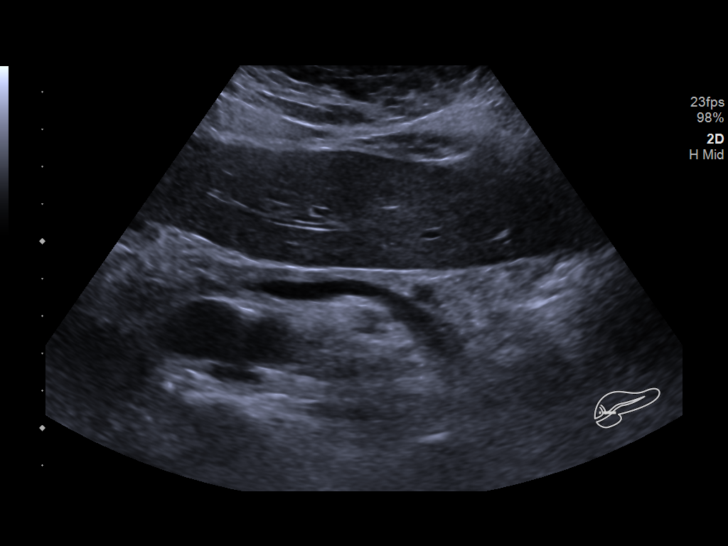
[im 67/99]
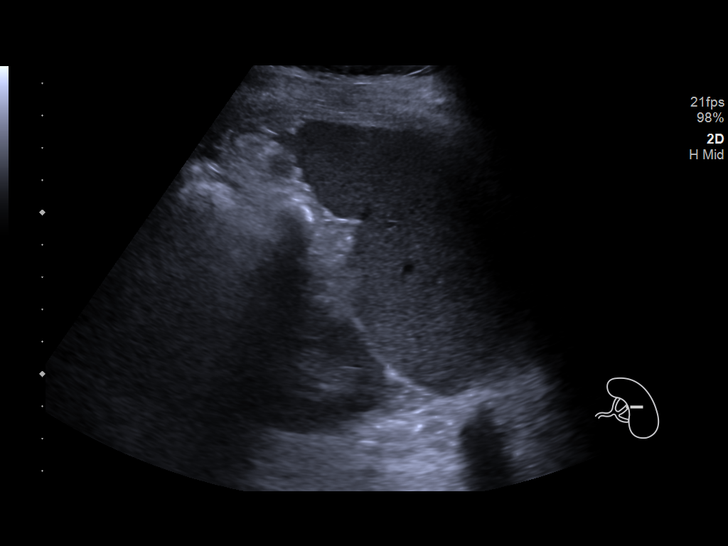
[im 76/99]
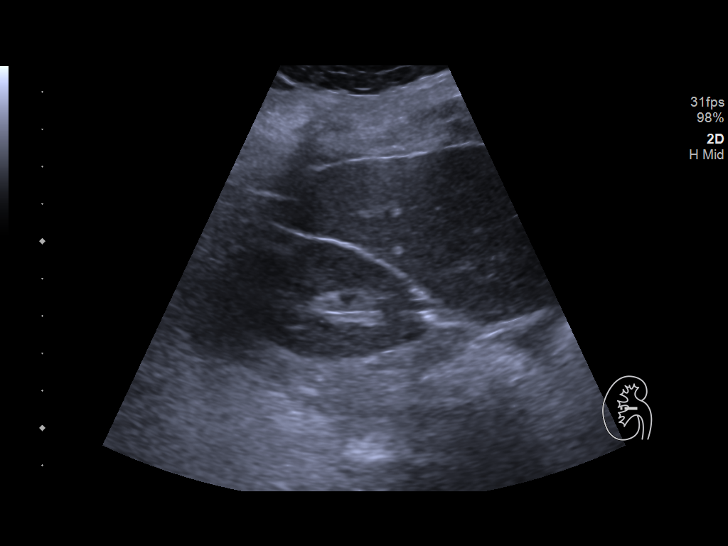
[im 85/99]
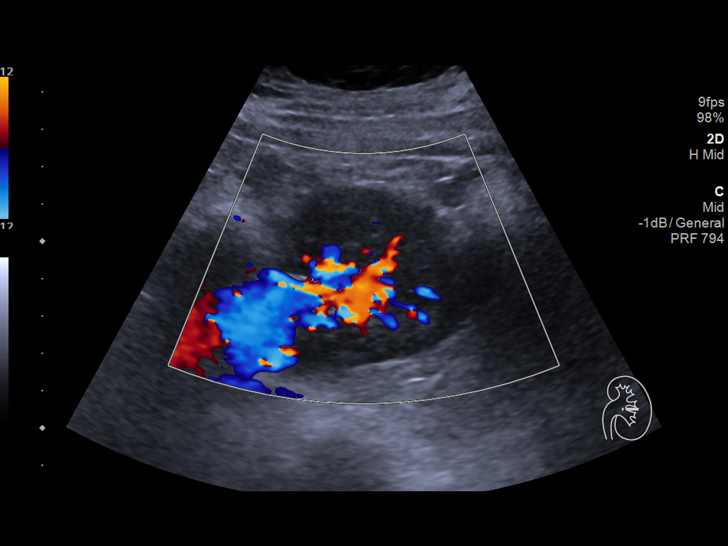
[im 94/99]
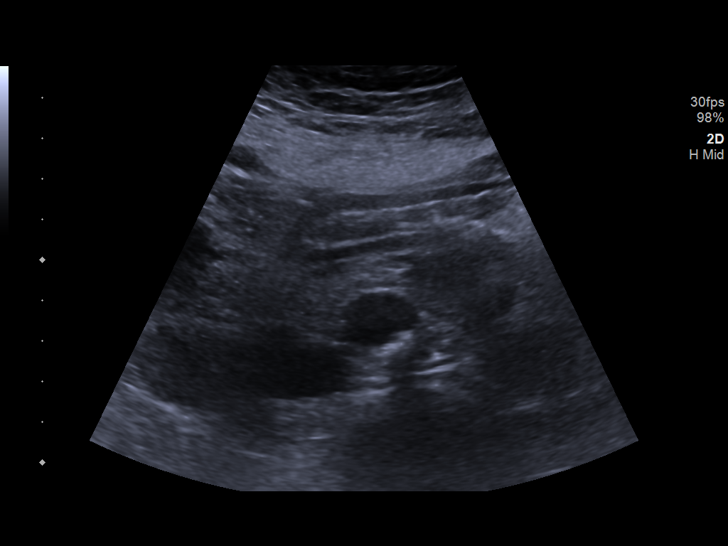

[Series 1001: us abdomen complete w/ elastography · 1 of 11 slices shown (2 of 2)]
[im 1/11]
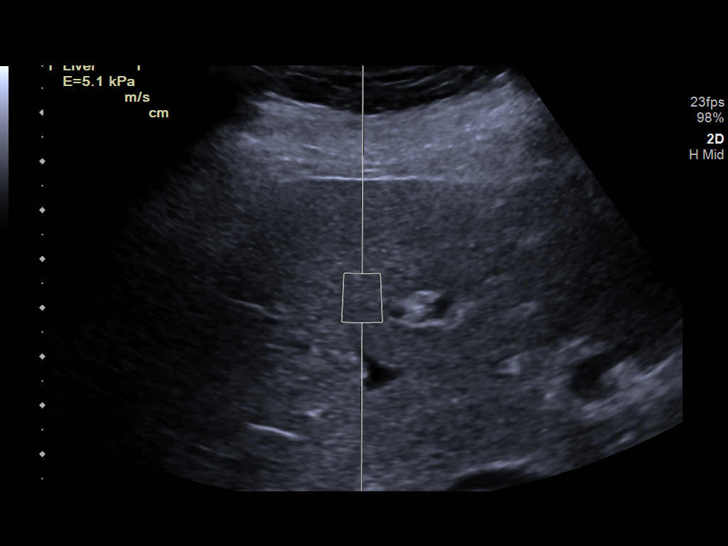

[12 of 25 positions shown; findings below may reference images not displayed]

FINDINGS: ULTRASOUND ABDOMEN

Gallbladder: No gallstones, gallbladder wall thickening, or
pericholecystic fluid. Negative sonographic Murphy's sign.

Common bile duct: Diameter: 4 mm

Liver: No focal lesion identified. Within normal limits in
parenchymal echogenicity. Portal vein is patent on color Doppler
imaging with normal direction of blood flow towards the liver.

IVC: No abnormality visualized.

Pancreas: Visualized portion unremarkable.

Spleen: Size and appearance within normal limits.

Right Kidney: Length: 11.6 cm. Echogenicity within normal limits. No
mass or hydronephrosis visualized.

Left Kidney: Length: 11.7 cm. Echogenicity within normal limits. No
mass or hydronephrosis visualized.

Abdominal aorta: No aneurysm visualized.

Other findings: None.

ULTRASOUND HEPATIC ELASTOGRAPHY

Device: Siemens Helix VTQ

Patient position: Supine

Transducer 5C1

Number of measurements: 11

Hepatic segment:  8

Median kPa:

IQR:

IQR/Median kPa ratio:

Data quality:  Good

Diagnostic category: ?9 kPa: in the absence of other known clinical
signs, rules out cACLD
IMPRESSION: ULTRASOUND ABDOMEN:

Unremarkable abdominal ultrasound.

ULTRASOUND HEPATIC ELASTOGRAPHY:

Median kPa:

Diagnostic category: ?9 kPa: in the absence of other known clinical
signs, rules out cACLD

The use of hepatic elastography is applicable to patients with viral
hepatitis and non-alcoholic fatty liver disease. At this time, there
is insufficient data for the referenced cut-off values and use in
other causes of liver disease, including alcoholic liver disease.
Patients, however, may be assessed by elastography and serve as
their own reference standard/baseline.

In patients with non-alcoholic liver disease, the values suggesting
compensated advanced chronic liver disease (cACLD) may be lower, and
patients may need additional testing with elasticity results of [DATE]
kPa.

Please note that abnormal hepatic elasticity and shear wave
velocities may also be identified in clinical settings other than
with hepatic fibrosis, such as: acute hepatitis, elevated right
heart and central venous pressures including use of beta blockers,
Keyshla disease ([place]), infiltrative processes such as
mastocytosis/amyloidosis/infiltrative tumor/lymphoma, extrahepatic
cholestasis, with hyperemia in the post-prandial state, and with
liver transplantation. Correlation with patient history, laboratory
data, and clinical condition recommended.
